# Patient Record
Sex: Male | Born: 1985 | Race: White | Hispanic: No | Marital: Married | Smoking: Never smoker
Health system: Southern US, Community
[De-identification: ages and names within clinical notes are randomized; demographics above are authoritative.]

## PROBLEM LIST (undated history)

## (undated) HISTORY — PX: LACERATION REPAIR: SHX5168

---

## 2014-07-25 ENCOUNTER — Encounter: Payer: Self-pay | Admitting: Emergency Medicine

## 2014-07-25 ENCOUNTER — Emergency Department (INDEPENDENT_AMBULATORY_CARE_PROVIDER_SITE_OTHER): Payer: Worker's Compensation

## 2014-07-25 ENCOUNTER — Emergency Department (INDEPENDENT_AMBULATORY_CARE_PROVIDER_SITE_OTHER)
Admission: EM | Admit: 2014-07-25 | Discharge: 2014-07-25 | Disposition: A | Discharge status: Home / Self Care | Payer: Worker's Compensation | Source: Home / Self Care | Attending: Emergency Medicine | Admitting: Emergency Medicine

## 2014-07-25 DIAGNOSIS — M25571 Pain in right ankle and joints of right foot: Secondary | ICD-10-CM | POA: Diagnosis not present

## 2014-07-25 NOTE — ED Notes (Signed)
Patient comes directly from work where he had cart roll onto back of heel and cause twisting of right ankle and foot. Cannot bear weight. No broken skin. Was given ice pack at work.

## 2014-07-25 NOTE — ED Provider Notes (Signed)
CSN: [id number]     Arrival date & time 07/25/14  1716 History   First MD Initiated Contact with Patient 07/25/14 1724     Chief Complaint  Patient presents with  . Foot Injury  . 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  Had immediate pain and difficulty walking.  No previous injury.  Pain moderate-severe, worse with motion, better with rest.  Hasn't used any meds/modalities yet.  History reviewed. No pertinent past medical history. Past Surgical History  Procedure Laterality Date  . Laceration repair      Dog bite as child; eye injury as adult   History reviewed. No pertinent family history. History  Substance Use Topics  . Smoking status: Never Smoker   . Smokeless tobacco: Not on file  . Alcohol Use: No   OB History    No data available     Review of Systems  All other systems reviewed and are negative.   Allergies  Review of patient's allergies indicates no known allergies.  Home Medications   Prior to Admission medications   Not on File   BP 115/73 mmHg  Pulse 73  Temp(Src) 98.7 F (37.1 C) (Oral)  Resp 18  Ht 5\' 11"  (1.803 m)  Wt 215 lb (97.523 kg)  BMI 30.00 kg/m2  SpO2 100% Physical Exam  Constitutional: She is oriented to person, place, and time. She appears well-developed and well-nourished. She does not appear ill. No distress.  HENT:  Head: Normocephalic and atraumatic.  Eyes: No scleral icterus.  Neck: Neck supple.  Cardiovascular: Regular rhythm and normal heart sounds.   Pulmonary/Chest: Effort normal and breath sounds normal. No respiratory distress.  Musculoskeletal:  R ankle/foot: FROM, + mild TTP generally at lateral foot and Achilles.  There is soft tissue swelling on the superior-lateral midfoot with associated tenderness.  No TTP medial/lateral malleolus, navicular, base of 5th, calcaneus,  Achilles, proximal fibula.  No swelling.  No ecchymoses.  Distal neurovascular status is intact.  Thompson test normal for Achilles function.  Negative Homan's.   Neurological: She is alert and oriented to person, place, and time.  Skin: Skin is warm and dry.  Psychiatric: She has a normal mood and affect. Her speech is normal.  Nursing note and vitals reviewed.   ED Course  Procedures (including critical care time) Labs Review Labs Reviewed - No data to display  Imaging Review Dg Ankle Complete Right  07/25/2014   CLINICAL DATA:  Injured right ankle/ foot at work.  Fell.  EXAM: RIGHT ANKLE - COMPLETE 3+ VIEW  COMPARISON:  None.  FINDINGS: There is no evidence of fracture, dislocation, or joint effusion. There is no evidence of arthropathy or other focal bone abnormality. Soft tissues are unremarkable.  IMPRESSION: Negative.   Electronically Signed   By: Signa Kellaylor  Stroud M.D.   On: 07/25/2014 18:57   Dg Foot Complete Right  07/25/2014   CLINICAL DATA:  Injury to right foot and ankle at work. Fell, and heard pop at the right ankle and foot. Posterior and lateral foot pain. Initial encounter.  EXAM: RIGHT FOOT COMPLETE - 3+ VIEW  COMPARISON:  None.  FINDINGS: There is no evidence of fracture or dislocation. The joint spaces are preserved. There is no evidence of talar subluxation; the subtalar joint is unremarkable in appearance. An os peroneum is noted.  No significant soft tissue abnormalities are seen.  IMPRESSION: 1. No evidence  of fracture or dislocation. 2. Os peroneum noted.   Electronically Signed   By: Roanna Raider M.D.   On: 07/25/2014 18:56     MDM   1. Pain in joint, ankle and foot, right   Xray taken of foot & ankle and read by radiology as above.  Rest, ice.  Work restrictions given.  Follow up next Mon/Tues in employer health as directed.  Paperwork given.    Worker's Comp Information   Return To Work: immediately with below restrictions   Work Restrictions: No work with R  foot/ankle until follow up visit  Referral (if indicated): N/A  MAKE FOLLOW-UP APPOINTMENT:  [facility] [place] [phone] [address], [address]  [place], [place] [address]       Marlaine Hind, MD 07/25/14 [phone]

## 2015-05-21 ENCOUNTER — Encounter: Payer: Self-pay | Admitting: Emergency Medicine

## 2015-05-21 ENCOUNTER — Emergency Department (INDEPENDENT_AMBULATORY_CARE_PROVIDER_SITE_OTHER)
Admission: EM | Admit: 2015-05-21 | Discharge: 2015-05-21 | Disposition: A | Discharge status: Home / Self Care | Payer: Worker's Compensation | Source: Home / Self Care | Attending: Family Medicine | Admitting: Family Medicine

## 2015-05-21 ENCOUNTER — Emergency Department (INDEPENDENT_AMBULATORY_CARE_PROVIDER_SITE_OTHER): Payer: Self-pay

## 2015-05-21 DIAGNOSIS — M25512 Pain in left shoulder: Secondary | ICD-10-CM

## 2015-05-21 DIAGNOSIS — M7702 Medial epicondylitis, left elbow: Secondary | ICD-10-CM

## 2015-05-21 DIAGNOSIS — G5692 Unspecified mononeuropathy of left upper limb: Secondary | ICD-10-CM

## 2015-05-21 DIAGNOSIS — M542 Cervicalgia: Secondary | ICD-10-CM

## 2015-05-21 MED ORDER — MELOXICAM 7.5 MG PO TABS: 7.5000 mg | ORAL_TABLET | Freq: Every day | ORAL | Status: DC

## 2015-05-21 MED ORDER — PREDNISONE 20 MG PO TABS: ORAL_TABLET | ORAL | Status: DC

## 2015-05-21 MED ORDER — MELOXICAM 7.5 MG PO TABS
7.5000 mg | ORAL_TABLET | Freq: Every day | ORAL | Status: DC
Start: 2015-05-21 — End: 2015-05-21

## 2015-05-21 NOTE — Discharge Instructions (Signed)
°  Meloxicam (Mobic) is an antiinflammatory to help with pain and inflammation.  Do not take ibuprofen, Advil, Aleve, or any other medications that contain NSAIDs while taking meloxicam as this may cause stomach upset or even ulcers if taken in large amounts for an extended period of time.  ° °

## 2015-05-21 NOTE — ED Provider Notes (Signed)
CSN: [id number]     Arrival date & time 05/21/15  1259 History   First MD Initiated Contact with Patient 05/21/15 1316     Chief Complaint  Patient presents with  . Hand Pain   (Consider location/radiation/quality/duration/timing/severity/associated sxs/prior Treatment) HPI  The pt is a 29yo male presenting to [facility] with c/o Left shoulder and arm soreness with associated numbness on the lateral aspect of his arm.  Pt works for [employer] .  Pain is minimal but he is concerned for the intermittent numbness and tingling along the ulnar aspect of his arm.  He reports lifting 12 packs of soda onto a shelf yesterday but he was not lifting or doing more than he normally does.    History reviewed. No pertinent past medical history. Past Surgical History  Procedure Laterality Date  . Laceration repair      Dog bite as child; eye injury as adult   No family history on file. Social History  Substance Use Topics  . Smoking status: Never Smoker   . Smokeless tobacco: None  . Alcohol Use: No    Review of Systems  Musculoskeletal: Positive for myalgias and arthralgias.       Left shoulder and arm  Skin: Negative for color change and wound.  Neurological: Positive for numbness. Negative for weakness.       Left arm    Allergies  Review of patient's allergies indicates no known allergies.  Home Medications   Prior to Admission medications   Medication Sig Start Date End Date Taking? Authorizing Provider  meloxicam (MOBIC) 7.5 MG tablet Take 1-2 tablets (7.5-15 mg total) by mouth daily. For 5 days, then daily as needed for pain 05/21/15   Junius Finner, PA-C  predniSONE (DELTASONE) 20 MG tablet 3 tabs po day one, then 2 po daily x 4 days 05/21/15   Junius Finner, PA-C   Meds Ordered and Administered this Visit  Medications - No data to display  BP 124/75 mmHg  Pulse 67  Temp(Src) 97.8 F (36.6 C) (Oral)  Ht 6' (1.829 m)  Wt 218 lb (98.884 kg)  BMI 29.56 kg/m2  SpO2 100% No data  found.   Physical Exam  Constitutional: He is oriented to person, place, and time. He appears well-developed and well-nourished.  HENT:  Head: Normocephalic and atraumatic.  Eyes: EOM are normal.  Neck: Normal range of motion.  Cardiovascular: Normal rate.   Pulses:      Radial pulses are 2+ on the left side.  Pulmonary/Chest: Effort normal.  Musculoskeletal: Normal range of motion. He exhibits tenderness. He exhibits no edema.  Left arm: full ROM. No deformity or edema. Tenderness over bicipital groove on anterior shoulder and over Left elbow medial epicondyle  5/5 strength bilaterally No midline spinal tenderness.  Neurological: He is alert and oriented to person, place, and time.  Decreased sensation in 4th and 5th digits on Left hand.  Skin: Skin is warm and dry.  Left arm: skin in tact. No ecchymosis or erythema.   Psychiatric: He has a normal mood and affect. His behavior is normal.  Nursing note and vitals reviewed.   ED Course  Procedures (including critical care time)  Labs Review Labs Reviewed - No data to display  Imaging Review Dg Cervical Spine Complete  05/21/2015  CLINICAL DATA:  Left hand tingling, numbness. EXAM: CERVICAL SPINE - COMPLETE 4+ VIEW COMPARISON:  None. FINDINGS: There is no evidence of cervical spine fracture or prevertebral soft tissue swelling. Alignment is normal. No  other significant bone abnormalities are identified. IMPRESSION: Negative cervical spine radiographs. Electronically Signed   By: Charlett NoseKevin  Dover M.D.   On: 05/21/2015 14:00   Dg Shoulder Left  05/21/2015  CLINICAL DATA:  Left arm tingling and numbness starting yesterday, neuropathy, left neck tightness EXAM: LEFT SHOULDER - 2+ VIEW COMPARISON:  None. FINDINGS: Three views of the left shoulder submitted. No acute fracture or subluxation. AC joint and glenohumeral joint are preserved. IMPRESSION: Negative. Electronically Signed   By: Natasha MeadLiviu  Pop M.D.   On: 05/21/2015 14:01      MDM    1. Left shoulder pain   2. Neuropathy, arm, left   3. Epicondylitis elbow, medial, left    Pt c/o Left arm soreness and tingling that started yesterday after lifting packs of soda at work. No known injury.  Tenderness to anterior shoulder and slightly altered sensation of 4th and 5th fingers on Left hand.    Plain films: normal exams  Symptoms likely due to inflammation of proximal biceps tendon, medial epicondylitis, and possible Left shoulder subacromial bursitis.    Encouraged rest, ice, and Meloxicam. Forearm band/strap provided for medial epicondylitis  Encouraged to f/u with Employee Health on Monday 4/3 or Tuesday 4/4 of next week if not improving.  No restrictions for work, however, he may wear arm strap. Patient verbalized understanding and agreement with treatment plan.    Junius Finnerrin O'Malley, PA-C 05/21/15 1501

## 2015-05-21 NOTE — ED Notes (Signed)
Yesterday morning putting 12 packs up on a shelf when my left hand went numb and felt shoulder pain. Intermittent tingling shock from elbow to fingers. Fingers and hand are numb and shoulder/pec pain is a 5

## 2015-05-28 ENCOUNTER — Encounter: Payer: Self-pay | Admitting: *Deleted

## 2015-05-28 ENCOUNTER — Emergency Department (INDEPENDENT_AMBULATORY_CARE_PROVIDER_SITE_OTHER)
Admission: EM | Admit: 2015-05-28 | Discharge: 2015-05-28 | Disposition: A | Discharge status: Home / Self Care | Payer: Worker's Compensation | Source: Home / Self Care | Attending: Family Medicine | Admitting: Family Medicine

## 2015-05-28 DIAGNOSIS — G5622 Lesion of ulnar nerve, left upper limb: Secondary | ICD-10-CM

## 2015-05-28 NOTE — ED Notes (Signed)
Pt is here today for a f/u of his workers comp injury. Still c/o LT hand numbness and arm pain.

## 2015-05-28 NOTE — Discharge Instructions (Signed)
Continue ice pack to left neck area three times daily.  Continue meloxicam.

## 2015-05-28 NOTE — ED Provider Notes (Signed)
CSN: [id number]     Arrival date & time 05/28/15  1520 History   First MD Initiated Contact with Patient 05/28/15 1629     Chief Complaint  Patient presents with  . Follow-up      HPI Comments: Patient returns for follow-up of occupational injury to left arm/shoulder.  He reports that his left arm and shoulder pain improved after finishing prednisone, but he now has persistent and constant tingling/vibrating paresthesia in his left hand, 4th and 5th fingers.  He also has mild soreness in his left trapezius and neck area, but reports that he has full range of motion of his left shoulder.  The history is provided by the patient.    History reviewed. No pertinent past medical history. Past Surgical History  Procedure Laterality Date  . Laceration repair      Dog bite as child; eye injury as adult   History reviewed. No pertinent family history. Social History  Substance Use Topics  . Smoking status: Never Smoker   . Smokeless tobacco: None  . Alcohol Use: No    Review of Systems  Constitutional: Negative for fever, chills, diaphoresis, appetite change and fatigue.  HENT: Negative.   Eyes: Negative.   Respiratory: Negative.   Cardiovascular: Negative.   Gastrointestinal: Negative.   Genitourinary: Negative.   Musculoskeletal: Positive for neck pain and neck stiffness. Negative for myalgias, back pain, joint swelling and arthralgias.  Skin: Negative.   Neurological: Positive for numbness.    Allergies  Review of patient's allergies indicates no known allergies.  Home Medications   Prior to Admission medications   Medication Sig Start Date End Date Taking? Authorizing Provider  meloxicam (MOBIC) 7.5 MG tablet Take 1-2 tablets (7.5-15 mg total) by mouth daily. For 5 days, then daily as needed for pain 05/21/15   Junius FinnerErin O'Malley, PA-C   Meds Ordered and Administered this Visit  Medications - No data to display  BP 119/71 mmHg  Pulse 80  Temp(Src) 98.1 F (36.7 C) (Oral)   Resp 16  Ht 6' (1.829 m)  Wt 215 lb (97.523 kg)  BMI 29.15 kg/m2  SpO2 96% No data found.   Physical Exam  Musculoskeletal:       Left shoulder: Normal.       Hands: Left hand has full range of motion all joints.  Slight decrease in grip strength.  Decreased sensation in ulnar aspect of hand including 4th and 5th fingers. Left elbow has full range of motion.  Palpation of the medial border of triceps muscle causes paresthesias in an ulnar nerve distribution.    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  Continue meloxicam.   Arrange referral to neurologist for further evaluation. Work restriction:  Limit use of left arm.  Lattie Haw, MD 05/28/15 [phone]

## 2015-06-06 ENCOUNTER — Encounter: Payer: Self-pay | Admitting: *Deleted

## 2015-06-06 ENCOUNTER — Emergency Department (INDEPENDENT_AMBULATORY_CARE_PROVIDER_SITE_OTHER)
Admission: EM | Admit: 2015-06-06 | Discharge: 2015-06-06 | Disposition: A | Discharge status: Home / Self Care | Payer: Worker's Compensation | Source: Home / Self Care | Attending: Family Medicine | Admitting: Family Medicine

## 2015-06-06 DIAGNOSIS — G5622 Lesion of ulnar nerve, left upper limb: Secondary | ICD-10-CM

## 2015-06-06 NOTE — ED Notes (Signed)
Pt was seen initially for left hand pain and numbness via WC claim through [employer].His claim was denied.  He has been on restricted duty. He currently denies pain and only has numbness to the left hand. He would like to return to work full duty.

## 2015-06-06 NOTE — Discharge Instructions (Signed)
May continue Ibuprofen 200mg , 4 tabs every 8 hours as needed.

## 2015-06-06 NOTE — ED Provider Notes (Signed)
CSN: [id number]     Arrival date & time 06/06/15  1213 History   First MD Initiated Contact with Patient 06/06/15 1315     Chief Complaint  Patient presents with  . Numbness    left hand      HPI Comments: Patient reports that he now has full use of his left upper extremity, and would like to resume his normal work duties.  His hand numbness has improved considerably, although he has some mild residual tingling in his left 4th and 5th fingers that does not affect the use of his hand.  The history is provided by the patient.    History reviewed. No pertinent past medical history. Past Surgical History  Procedure Laterality Date  . Laceration repair      Dog bite as child; eye injury as adult   History reviewed. No pertinent family history. Social History  Substance Use Topics  . Smoking status: Never Smoker   . Smokeless tobacco: None  . Alcohol Use: No    Review of Systems  Musculoskeletal: Negative for myalgias, joint swelling and arthralgias.  Neurological: Positive for numbness. Negative for tremors and weakness.  All other systems reviewed and are negative.   Allergies  Review of patient's allergies indicates no known allergies.  Home Medications   Prior to Admission medications   Medication Sig Start Date End Date Taking? Authorizing Provider  meloxicam (MOBIC) 7.5 MG tablet Take 1-2 tablets (7.5-15 mg total) by mouth daily. For 5 days, then daily as needed for pain 05/21/15   Junius FinnerErin O'Malley, PA-C   Meds Ordered and Administered this Visit  Medications - No data to display  BP 108/72 mmHg  Pulse 74  Wt 215 lb (97.523 kg)  SpO2 98% No data found.   Physical Exam  Constitutional: He is oriented to person, place, and time. He appears well-developed and well-nourished. No distress.  HENT:  Head: Normocephalic.  Eyes: Conjunctivae are normal. Pupils are equal, round, and reactive to light.  Neck: Normal range of motion.  Cardiovascular: Normal heart sounds.    Pulmonary/Chest: Breath sounds normal.  Musculoskeletal: He exhibits no edema or tenderness.  Left shoulder, elbow, wrist, and hand have range of motion all joints.  Left hand grip normal.  Neurological: He is alert and oriented to person, place, and time.  Skin: Skin is warm and dry.  Nursing note and vitals reviewed.   ED Course  Procedures    MDM   1. Ulnar neuropathy of left upper extremity; improved    May continue Ibuprofen 200mg , 4 tabs every 8 hours as needed. Resume normal work duties.   Lattie HawStephen A Beese, MD 06/09/15 [phone]

## 2017-03-14 IMAGING — CR DG CERVICAL SPINE COMPLETE 4+V
6 series · 6 of 6 positions shown · non-contrast
Comparison: None.

CLINICAL DATA: Left hand tingling, numbness.

EXAM:
CERVICAL SPINE - COMPLETE 4+ VIEW

[c-spine lat]
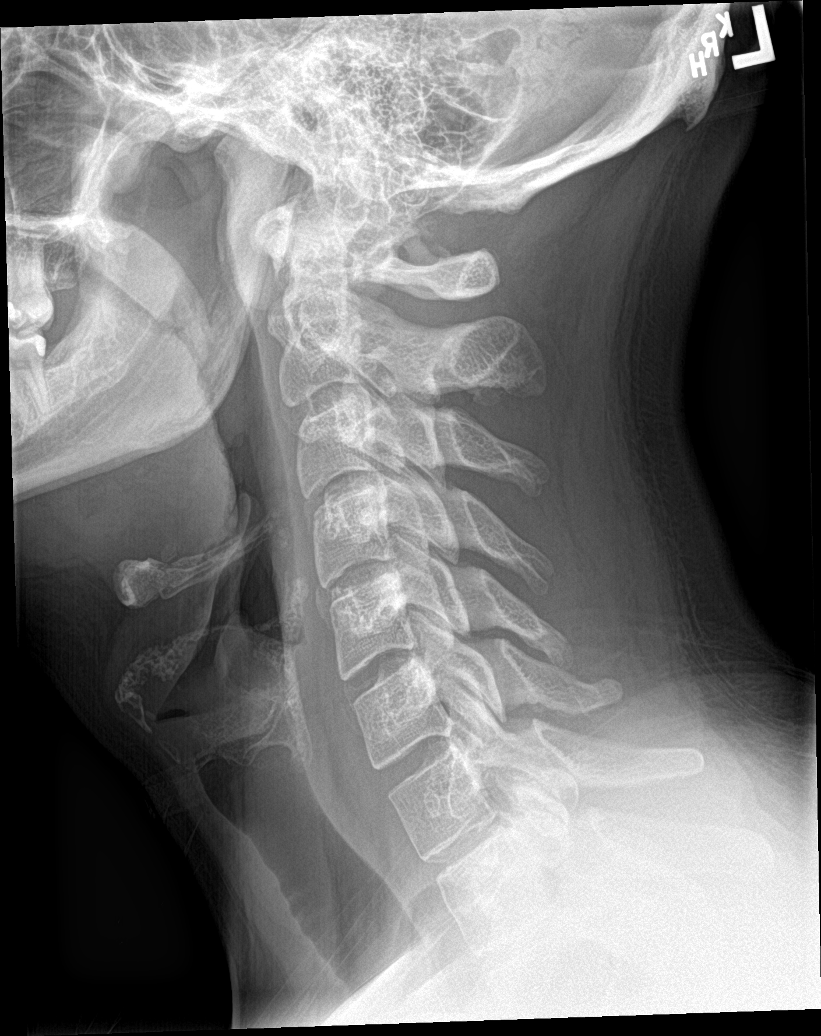

[c-spine obl (1 of 2)]
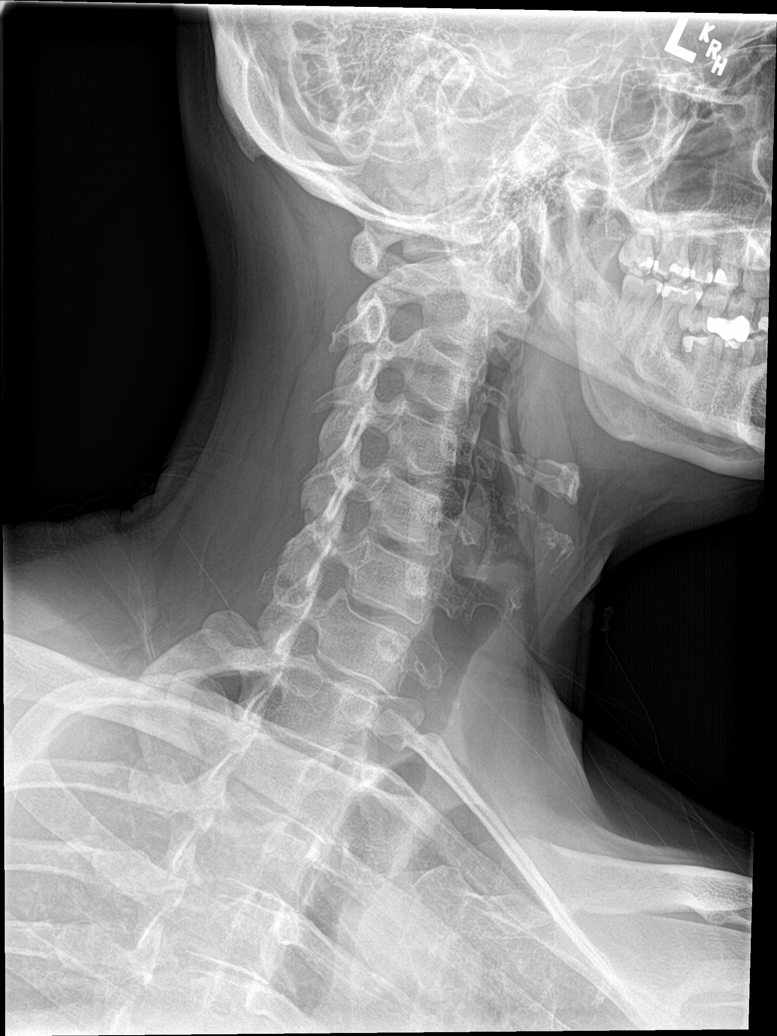

[c-spine obl (2 of 2)]
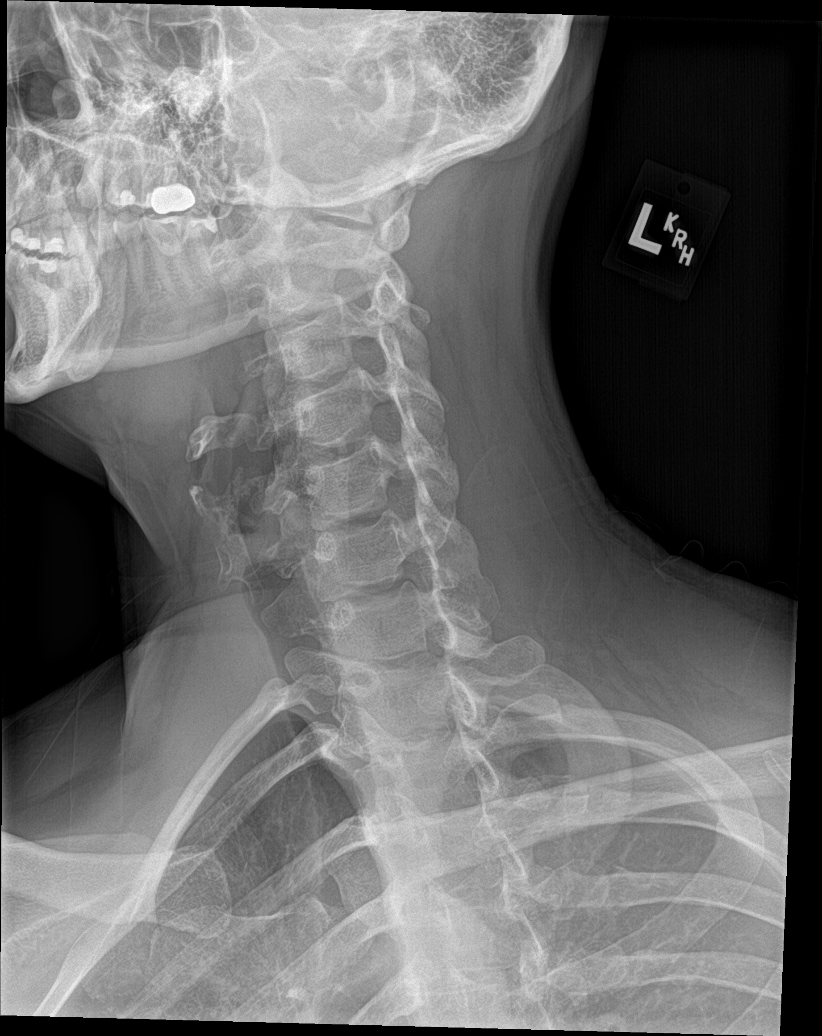

[c-spine ap]
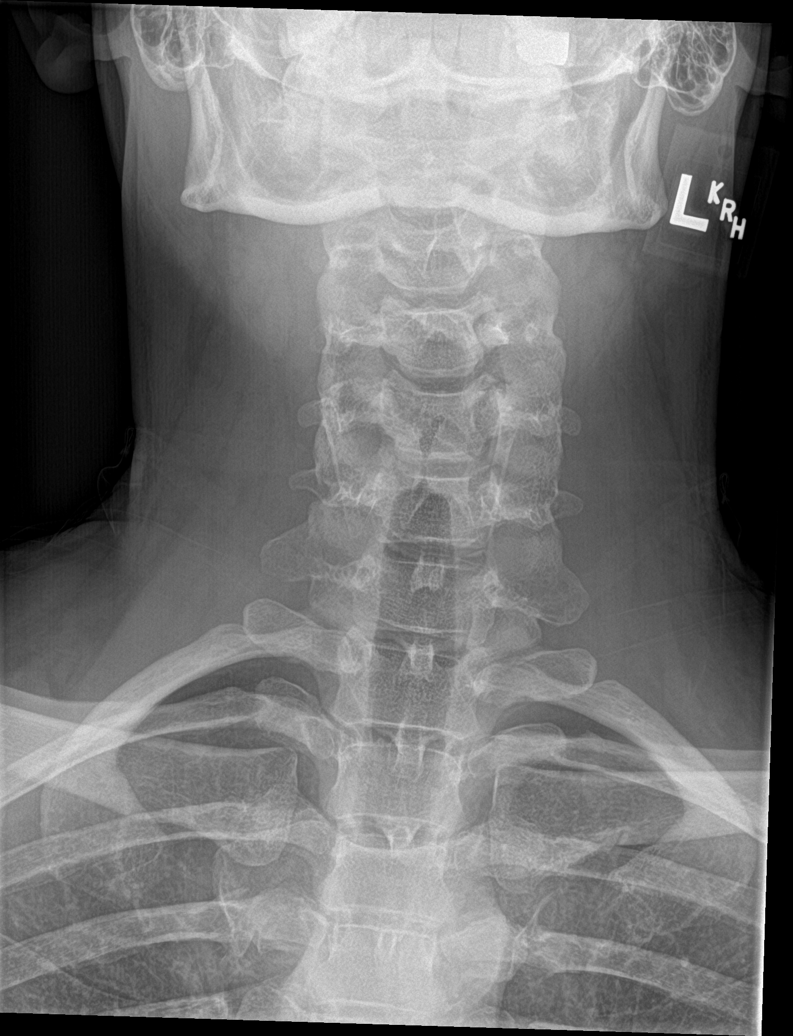

[c-spine open mouth]
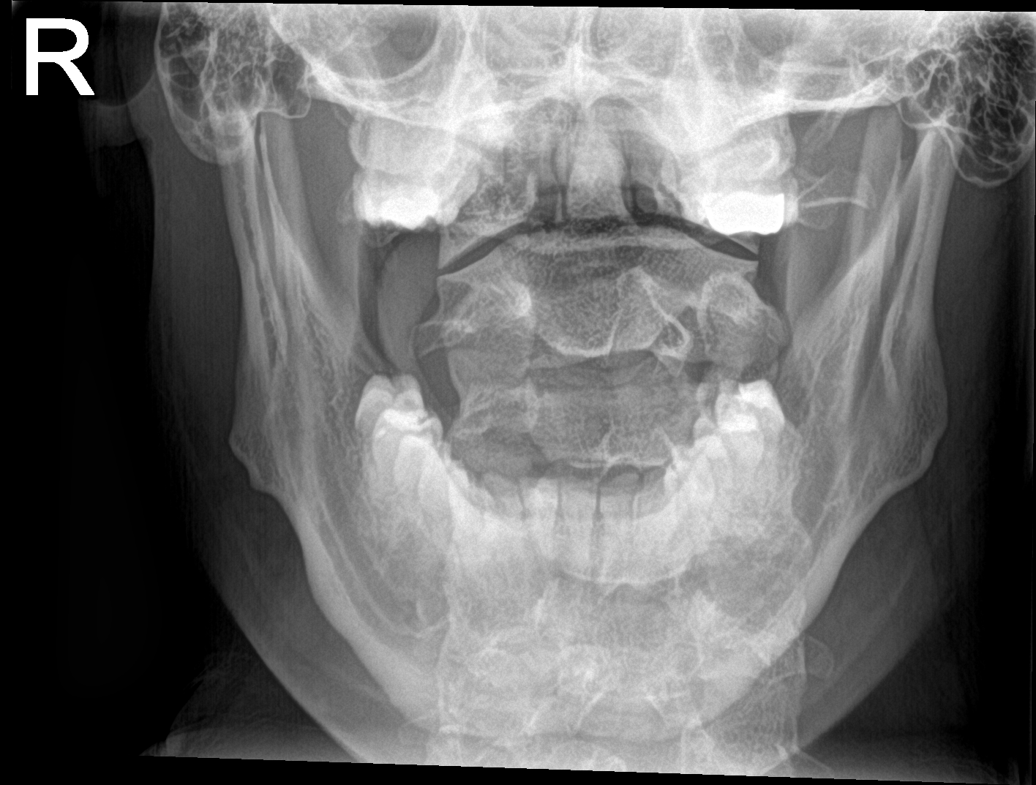

[c-spine swimmers]
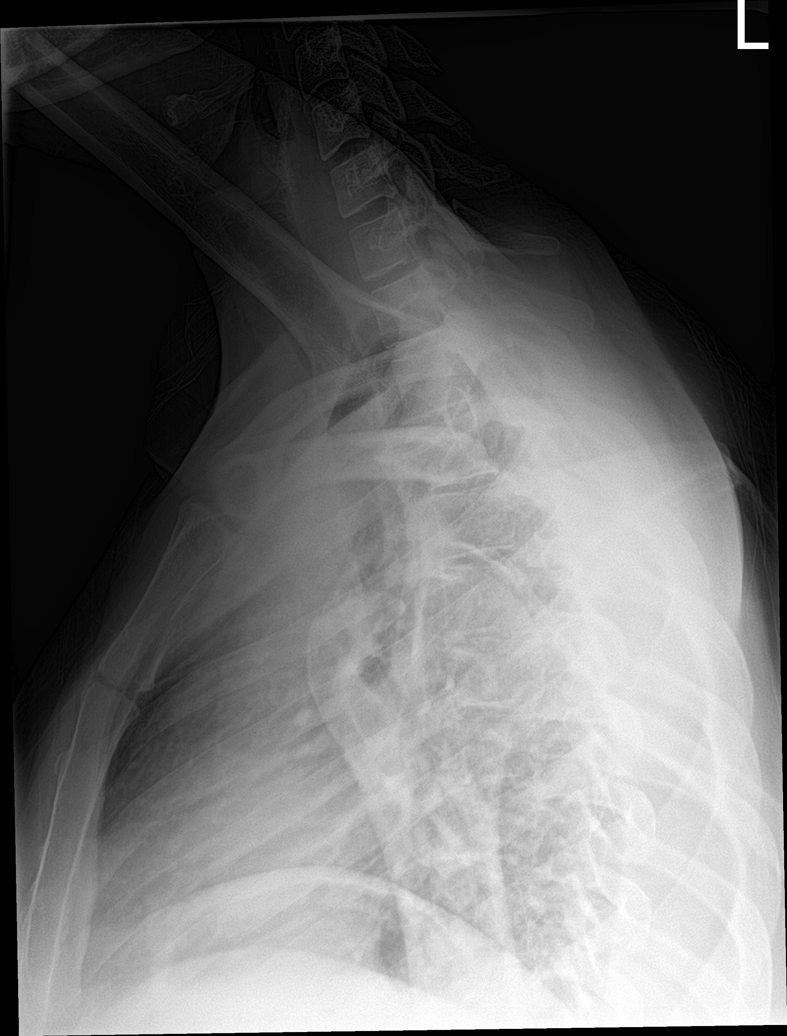

[6 of 6 positions shown; findings below may reference images not displayed]

FINDINGS: There is no evidence of cervical spine fracture or prevertebral soft
tissue swelling. Alignment is normal. No other significant bone
abnormalities are identified.
IMPRESSION: Negative cervical spine radiographs.

## 2018-11-09 ENCOUNTER — Emergency Department (INDEPENDENT_AMBULATORY_CARE_PROVIDER_SITE_OTHER)
Admission: EM | Admit: 2018-11-09 | Discharge: 2018-11-09 | Disposition: A | Discharge status: Home / Self Care | Payer: PRIVATE HEALTH INSURANCE | Source: Home / Self Care

## 2018-11-09 ENCOUNTER — Encounter: Payer: Self-pay | Admitting: Emergency Medicine

## 2018-11-09 ENCOUNTER — Other Ambulatory Visit: Payer: Self-pay

## 2018-11-09 DIAGNOSIS — Z299 Encounter for prophylactic measures, unspecified: Secondary | ICD-10-CM

## 2018-11-09 DIAGNOSIS — Z23 Encounter for immunization: Secondary | ICD-10-CM | POA: Diagnosis not present

## 2018-11-09 MED ORDER — TETANUS-DIPHTH-ACELL PERTUSSIS 5-2.5-18.5 LF-MCG/0.5 IM SUSP
0.5000 mL | Freq: Once | INTRAMUSCULAR | Status: AC
  Administered 2018-11-09: 0.5 mL via INTRAMUSCULAR

## 2018-11-09 NOTE — ED Triage Notes (Signed)
TDAP

## 2019-03-20 ENCOUNTER — Emergency Department (INDEPENDENT_AMBULATORY_CARE_PROVIDER_SITE_OTHER): Payer: Commercial Managed Care - PPO

## 2019-03-20 ENCOUNTER — Emergency Department (INDEPENDENT_AMBULATORY_CARE_PROVIDER_SITE_OTHER)
Admission: EM | Admit: 2019-03-20 | Discharge: 2019-03-20 | Disposition: A | Discharge status: Home / Self Care | Payer: Commercial Managed Care - PPO | Source: Home / Self Care | Attending: Family Medicine | Admitting: Family Medicine

## 2019-03-20 ENCOUNTER — Other Ambulatory Visit: Payer: Self-pay

## 2019-03-20 DIAGNOSIS — M542 Cervicalgia: Secondary | ICD-10-CM | POA: Diagnosis not present

## 2019-03-20 DIAGNOSIS — M5412 Radiculopathy, cervical region: Secondary | ICD-10-CM

## 2019-03-20 MED ORDER — CYCLOBENZAPRINE HCL 10 MG PO TABS: 10.0000 mg | ORAL_TABLET | Freq: Every day | ORAL | 0 refills | Status: DC

## 2019-03-20 MED ORDER — PREDNISONE 20 MG PO TABS: ORAL_TABLET | ORAL | 0 refills | Status: DC

## 2019-03-20 NOTE — ED Triage Notes (Signed)
Couple of weeks ago had a couple of pains in the chest.  It has happened several times over the last couple of weeks.  It has become worse in the each time.  Feels like the left arm and left side of face feels weird.

## 2019-03-20 NOTE — ED Provider Notes (Signed)
[facility]    CSN: [id number] Arrival date & time: 03/20/19  1426      History   Chief Complaint Chest pain   HPI Cesar Daniel is a 34 y.o. male.   About 2 weeks ago patient had a sudden episode of shock-like pain in his left upper chest, shoulder and upper arm. The pain occurred again 4 to 5 days ago, and again 2 days ago.  The episodes are becoming more intense and last longer.  He reports that he has a "weird" sensation in his left arm and face.  He denies nausea/vomiting, shortness of breath, sweats with occurrence of his symptoms.  He denies any recent injury or change in activities. He is assymptomatic at present. On reflection, he recalls that he has had infrequent similar episodes as far back as his teen years. Family history positive for MI in his father.    The history is provided by the patient.  Chest Pain Pain location:  L chest Pain quality: shooting and stabbing   Pain radiates to:  Neck, L shoulder, L arm and upper back Pain severity:  Mild Onset quality:  Sudden Timing:  Sporadic Progression:  Worsening Chronicity:  Recurrent Context: movement   Relieved by:  None tried Worsened by:  Certain positions and movement Ineffective treatments:  None tried Associated symptoms: no abdominal pain, no anorexia, no back pain, no cough, no diaphoresis, no dysphagia, no fatigue, no fever, no headache, no heartburn, no lower extremity edema, no nausea, no near-syncope, no numbness, no palpitations, no shortness of breath, no syncope, no vomiting and no weakness   Risk factors: male sex     History reviewed. No recorded past medical problems  There are no current problems to display for this patient.   Past Surgical History:  Procedure Laterality Date   LACERATION REPAIR     Dog bite as child; eye injury as adult       Home Medications    Prior to Admission medications   Medication Sig Start Date End Date Taking? Authorizing Provider    cyclobenzaprine (FLEXERIL) 10 MG tablet Take 1 tablet (10 mg total) by mouth at bedtime. 03/20/19   Kandra Nicolas, MD  predniSONE (DELTASONE) 20 MG tablet Take one tab by mouth twice daily for 4 days, then one daily. Take with food. 03/20/19   Kandra Nicolas, MD    Family History Family History  Problem Relation Age of Onset   Diabetes Father    Heart failure/MI Father     Social History Social History   Tobacco Use   Smoking status: Never Smoker   Smokeless tobacco: Never Used  Substance Use Topics   Alcohol use: No   Drug use: No     Allergies   Patient has no known allergies.   Review of Systems Review of Systems  Constitutional: Negative for diaphoresis, fatigue and fever.  HENT: Negative for trouble swallowing.   Respiratory: Negative for cough and shortness of breath.   Cardiovascular: Positive for chest pain. Negative for palpitations, syncope and near-syncope.  Gastrointestinal: Negative for abdominal pain, anorexia, heartburn, nausea and vomiting.  Musculoskeletal: Negative for back pain.  Neurological: Negative for weakness, numbness and headaches.  All other systems reviewed and are negative.    Physical Exam Triage Vital Signs ED Triage Vitals  Enc Vitals Group     BP 03/20/19 1511 117/77     Pulse Rate 03/20/19 1511 73     Resp 03/20/19 1511 20  Temp 03/20/19 1511 98.3 F (36.8 C)     Temp Source 03/20/19 1511 Oral     SpO2 03/20/19 1511 98 %     Weight 03/20/19 1453 215 lb (97.5 kg)     Height 03/20/19 1453 5\' 11"  (1.803 m)     Head Circumference --      Peak Flow --      Pain Score 03/20/19 1453 4     Pain Loc --      Pain Edu? --      Excl. in GC? --    No data found.  Updated Vital Signs BP 117/77 (BP Location: Left Arm)    Pulse 73    Temp 98.3 F (36.8 C) (Oral)    Resp 20    Ht 5\' 11"  (1.803 m)    Wt 97.5 kg    SpO2 98%    BMI 29.99 kg/m   Visual Acuity Right Eye Distance:   Left Eye Distance:   Bilateral  Distance:    Right Eye Near:   Left Eye Near:    Bilateral Near:     Physical Exam Vitals and nursing note reviewed.  Constitutional:      General: He is not in acute distress.    Appearance: He is not ill-appearing.  HENT:     Head: Normocephalic.     Right Ear: External ear normal.     Left Ear: External ear normal.     Nose: Nose normal.     Mouth/Throat:     Pharynx: Oropharynx is clear.  Eyes:     Conjunctiva/sclera: Conjunctivae normal.     Pupils: Pupils are equal, round, and reactive to light.  Neck:     Comments: Patient's symptoms are reproduced by flexion of his neck laterally to the left, rotation of his head to the left, and flexion of his head (chin to chest). Cardiovascular:     Rate and Rhythm: Normal rate and regular rhythm.     Heart sounds: Normal heart sounds.  Pulmonary:     Breath sounds: Normal breath sounds.  Abdominal:     Tenderness: There is no abdominal tenderness.  Musculoskeletal:     Cervical back: Normal range of motion and neck supple.     Right lower leg: No edema.     Left lower leg: No edema.  Skin:    General: Skin is warm and dry.     Findings: No rash.          Comments: Patient's distribution of pain is noted on diagram, but there is no tenderness to palpation in these areas.  Neurological:     General: No focal deficit present.     Mental Status: He is alert and oriented to person, place, and time.      UC Treatments / Results  Labs (all labs ordered are listed, but only abnormal results are displayed) Labs Reviewed - No data to display  EKG  Rate:  66 BPM PR:  168 msec QT:  376 msec QTcH:  394 msec QRSD:  92 msec QRS axis:  51 degrees Interpretation:  normal sinus rhythm; possible left atrial enlargement; no acute changes  Radiology DG Cervical Spine Complete  Result Date: 03/20/2019 CLINICAL DATA:  Cervicalgia with radicular type pain EXAM: CERVICAL SPINE - COMPLETE 4+ VIEW COMPARISON:  None. FINDINGS:  Frontal, lateral, open-mouth odontoid, and bilateral oblique views were obtained. There is no appreciable fracture or spondylolisthesis. Prevertebral soft tissues and predental space regions are  normal. The disc spaces appear normal. There is no appreciable exit foraminal narrowing on the oblique views. There is reversal of lordotic curvature. Lung apices are clear. IMPRESSION: Reversal of lordotic curvature, a finding likely indicative of muscle spasm. No appreciable fracture or spondylolisthesis. No appreciable arthropathy. Electronically Signed   By: Bretta Bang III M.D.   On: 03/20/2019 16:06    Procedures Procedures (including critical care time)  Medications Ordered in UC Medications - No data to display  Initial Impression / Assessment and Plan / UC Course  I have reviewed the triage vital signs and the nursing notes.  Pertinent labs & imaging results that were available during my care of the patient were reviewed by me and considered in my medical decision making (see chart for details).    Begin prednisone burst/taper, & Flexeril at bedtime.  Followup with Dr. Rodney [facility] ([facility]) in one week for follow-up.   Final Clinical Impressions(s) / UC Diagnoses   Final diagnoses:  Neck pain on left side  Cervical radiculopathy, chronic     Discharge Instructions     Apply ice pack for 20 to 30 minutes, 3 to 4 times daily  Continue until pain and swelling decrease.  May take Tylenol, if needed, for pain.    ED Prescriptions    Medication Sig Dispense Auth. Provider   predniSONE (DELTASONE) 20 MG tablet Take one tab by mouth twice daily for 4 days, then one daily. Take with food. 12 tablet Lattie Haw, MD   cyclobenzaprine (FLEXERIL) 10 MG tablet Take 1 tablet (10 mg total) by mouth at bedtime. 10 tablet Lattie Haw, MD        Lattie Haw, MD 03/22/19 [phone]

## 2019-03-20 NOTE — Discharge Instructions (Addendum)
Apply ice pack for 20 to 30 minutes, 3 to 4 times daily  Continue until pain and swelling decrease.  May take Tylenol, if needed, for pain.

## 2019-03-28 ENCOUNTER — Ambulatory Visit (INDEPENDENT_AMBULATORY_CARE_PROVIDER_SITE_OTHER): Payer: Commercial Managed Care - PPO | Admitting: Sports Medicine

## 2019-03-28 ENCOUNTER — Other Ambulatory Visit: Payer: Self-pay

## 2019-03-28 ENCOUNTER — Ambulatory Visit (INDEPENDENT_AMBULATORY_CARE_PROVIDER_SITE_OTHER): Payer: Commercial Managed Care - PPO

## 2019-03-28 DIAGNOSIS — M7989 Other specified soft tissue disorders: Secondary | ICD-10-CM | POA: Insufficient documentation

## 2019-03-28 DIAGNOSIS — M940 Chondrocostal junction syndrome [Tietze]: Secondary | ICD-10-CM | POA: Insufficient documentation

## 2019-03-28 MED ORDER — MELOXICAM 15 MG PO TABS
ORAL_TABLET | ORAL | 3 refills | Status: DC
Start: 2019-03-28 — End: 2020-02-07

## 2019-03-28 NOTE — Assessment & Plan Note (Signed)
Cesar Daniel has an odd constellation of symptoms, he does endorse a momentary swelling of his left leg followed by some pleuritic chest pain. For this reason we are going to check a D-dimer. He is also going to establish care with one of our primary care providers here.

## 2019-03-28 NOTE — Patient Instructions (Signed)
Costochondritis  Costochondritis is swelling and irritation (inflammation) of the tissue (cartilage) that connects your ribs to your breastbone (sternum). This causes pain in the front of your chest. The pain usually starts gradually and involves more than one rib. What are the causes? The exact cause of this condition is not always known. It results from stress on the cartilage where your ribs attach to your sternum. The cause of this stress could be:  Chest injury (trauma).  Exercise or activity, such as lifting.  Severe coughing. What increases the risk? You may be at higher risk for this condition if you:  Are male.  Are 30?32 years old.  Recently started a new exercise or work activity.  Have low levels of vitamin D.  Have a condition that makes you cough frequently. What are the signs or symptoms? The main symptom of this condition is chest pain. The pain:  Usually starts gradually and can be sharp or dull.  Gets worse with deep breathing, coughing, or exercise.  Gets better with rest.  May be worse when you press on the sternum-rib connection (tenderness). How is this diagnosed? This condition is diagnosed based on your symptoms, medical history, and a physical exam. Your health care provider will check for tenderness when pressing on your sternum. This is the most important finding. You may also have tests to rule out other causes of chest pain. These may include:  A chest X-ray to check for lung problems.  An electrocardiogram (ECG) to see if you have a heart problem that could be causing the pain.  An imaging scan to rule out a chest or rib fracture. How is this treated? This condition usually goes away on its own over time. Your health care provider may prescribe an NSAID to reduce pain and inflammation. Your health care provider may also suggest that you:  Rest and avoid activities that make pain worse.  Apply heat or cold to the area to reduce pain and  inflammation.  Do exercises to stretch your chest muscles. If these treatments do not help, your health care provider may inject a numbing medicine at the sternum-rib connection to help relieve the pain. Follow these instructions at home:  Avoid activities that make pain worse. This includes any activities that use chest, abdominal, and side muscles.  If directed, put ice on the painful area: ? Put ice in a plastic bag. ? Place a towel between your skin and the bag. ? Leave the ice on for 20 minutes, 2-3 times a day.  If directed, apply heat to the affected area as often as told by your health care provider. Use the heat source that your health care provider recommends, such as a moist heat pack or a heating pad. ? Place a towel between your skin and the heat source. ? Leave the heat on for 20-30 minutes. ? Remove the heat if your skin turns bright red. This is especially important if you are unable to feel pain, heat, or cold. You may have a greater risk of getting burned.  Take over-the-counter and prescription medicines only as told by your health care provider.  Return to your normal activities as told by your health care provider. Ask your health care provider what activities are safe for you.  Keep all follow-up visits as told by your health care provider. This is important. Contact a health care provider if:  You have chills or a fever.  Your pain does not go away or it gets   worse.  You have a cough that does not go away (is persistent). Get help right away if:  You have shortness of breath. This information is not intended to replace advice given to you by your health care provider. Make sure you discuss any questions you have with your health care provider. Document Revised: 02/23/2017 Document Reviewed: 06/04/2015 Elsevier Patient Education  2020 Elsevier Inc.  

## 2019-03-28 NOTE — Progress Notes (Signed)
    Procedures performed today:    None.  Independent interpretation of tests performed by another provider:   Cervical spine x-ray personally reviewed and shows straightening of the normal cervical lordosis but otherwise negative.  Impression and Recommendations:    Left leg swelling Cesar Daniel has an odd constellation of symptoms, he does endorse a momentary swelling of his left leg followed by some pleuritic chest pain. For this reason we are going to check a D-dimer. He is also going to establish care with one of our primary care providers here.  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   For the past week he has had chest wall pain with radiation up into the neck and left arm but not exertional, no diaphoresis, no palpitations. It is pleuritic, it is reproducible with palpation over the left mid costal cartilage all consistent with costochondritis. When he finishes his prednisone we will start meloxicam, adding a chest x-ray. I reassured him that this would likely get better on its own. Return to see me in 2 weeks.    ___________________________________________ Cesar Daniel. Cesar Daniel, M.D., ABFM., CAQSM. Primary Care and Sports Medicine [facility]  Adjunct Instructor of Family Medicine  [employer]

## 2019-03-28 NOTE — Assessment & Plan Note (Signed)
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   For the past week he has had chest wall pain with radiation up into the neck and left arm but not exertional, no diaphoresis, no palpitations. It is pleuritic, it is reproducible with palpation over the left mid costal cartilage all consistent with costochondritis. When he finishes his prednisone we will start meloxicam, adding a chest x-ray. I reassured him that this would likely get better on its own. Return to see me in 2 weeks.

## 2019-03-29 LAB — COMPLETE METABOLIC PANEL WITH GFR
AG Ratio: 1.7 (calc) (ref 1.0–2.5)
ALT: 22 U/L (ref 9–46)
AST: 17 U/L (ref 10–40)
Albumin: 4.8 g/dL (ref 3.6–5.1)
Alkaline phosphatase (APISO): 51 U/L (ref 36–130)
BUN: 14 mg/dL (ref 7–25)
CO2: 27 mmol/L (ref 20–32)
Calcium: 10 mg/dL (ref 8.6–10.3)
Chloride: 102 mmol/L (ref 98–110)
Creat: 0.89 mg/dL (ref 0.60–1.35)
GFR, Est African American: 132 mL/min/{1.73_m2} (ref >=60)
GFR, Est Non African American: 114 mL/min/{1.73_m2} (ref >=60)
Globulin: 2.9 g/dL (calc) (ref 1.9–3.7)
Glucose, Bld: 77 mg/dL (ref 65–99)
Potassium: 4.3 mmol/L (ref 3.5–5.3)
Sodium: 141 mmol/L (ref 135–146)
Total Bilirubin: 0.3 mg/dL (ref 0.2–1.2)
Total Protein: 7.7 g/dL (ref 6.1–8.1)

## 2019-03-29 LAB — CBC WITH DIFFERENTIAL/PLATELET
Absolute Monocytes: 564 cells/uL (ref 200–950)
Basophils Absolute: 102 cells/uL (ref 0–200)
Basophils Relative: 1.5 %
Eosinophils Absolute: 109 cells/uL (ref 15–500)
Eosinophils Relative: 1.6 %
HCT: 45.3 % (ref 38.5–50.0)
Hemoglobin: 15.5 g/dL (ref 13.2–17.1)
Lymphs Abs: 3495 cells/uL (ref 850–3900)
MCH: 30.5 pg (ref 27.0–33.0)
MCHC: 34.2 g/dL (ref 32.0–36.0)
MCV: 89.2 fL (ref 80.0–100.0)
MPV: 10.2 fL (ref 7.5–12.5)
Monocytes Relative: 8.3 %
Neutro Abs: 2530 cells/uL (ref 1500–7800)
Neutrophils Relative %: 37.2 %
Platelets: 336 10*3/uL (ref 140–400)
RBC: 5.08 10*6/uL (ref 4.20–5.80)
RDW: 12.9 % (ref 11.0–15.0)
Total Lymphocyte: 51.4 %
WBC: 6.8 10*3/uL (ref 3.8–10.8)

## 2019-03-29 LAB — D-DIMER, QUANTITATIVE (NOT AT ARMC): D-Dimer, Quant: 0.46 mcg/mL FEU (ref <0.50)

## 2019-04-09 ENCOUNTER — Ambulatory Visit: Payer: Commercial Managed Care - PPO | Admitting: Family Medicine

## 2019-04-20 ENCOUNTER — Ambulatory Visit: Payer: Commercial Managed Care - PPO | Admitting: Family Medicine

## 2019-04-30 ENCOUNTER — Encounter: Payer: Self-pay | Admitting: Family Medicine

## 2019-04-30 ENCOUNTER — Other Ambulatory Visit: Payer: Self-pay

## 2019-04-30 ENCOUNTER — Ambulatory Visit (INDEPENDENT_AMBULATORY_CARE_PROVIDER_SITE_OTHER): Payer: Commercial Managed Care - PPO | Admitting: Family Medicine

## 2019-04-30 VITALS — BP 116/76 | HR 80 | Temp 97.6°F | Ht 71.0 in | Wt 232.0 lb

## 2019-04-30 DIAGNOSIS — Z13220 Encounter for screening for lipoid disorders: Secondary | ICD-10-CM

## 2019-04-30 DIAGNOSIS — M7989 Other specified soft tissue disorders: Secondary | ICD-10-CM

## 2019-04-30 DIAGNOSIS — Z0000 Encounter for general adult medical examination without abnormal findings: Secondary | ICD-10-CM | POA: Diagnosis not present

## 2019-04-30 DIAGNOSIS — M940 Chondrocostal junction syndrome [Tietze]: Secondary | ICD-10-CM | POA: Diagnosis not present

## 2019-04-30 NOTE — Assessment & Plan Note (Signed)
This has improved since initial evaluation by Dr. Benjamin Stain last month He may continue meloxicam as needed.

## 2019-04-30 NOTE — Progress Notes (Signed)
Cesar Daniel - 34 y.o. male MRN [id number]  Date of birth: 28-Jan-1986  Subjective Chief Complaint  Patient presents with  . Establish Care    HPI Cesar Daniel is a 34 y.o. male here today for initial visit with new PCP.  He was seen about 1 month ago by Dr. Dianah Field from chest pain and leg swelling.  Chest pain thought to be related to costochondritis however CXR and D-dimer checked due to leg swelling.  He has not experienced any further swelling in his leg since that visit and chest pain has gotten much better.  He does have a family history of heart disease including father, PGF and PGGF with MI.    ROS:  A comprehensive ROS was completed and negative except as noted per HPI  No Known Allergies  No past medical history on file.  Past Surgical History:  Procedure Laterality Date  . LACERATION REPAIR     Dog bite as child; eye injury as adult    Social History   Socioeconomic History  . Marital status: Single    Spouse name: Not on file  . Number of children: Not on file  . Years of education: Not on file  . Highest education level: Not on file  Occupational History  . Not on file  Tobacco Use  . Smoking status: Never Smoker  . Smokeless tobacco: Never Used  Substance and Sexual Activity  . Alcohol use: No  . Drug use: No  . Sexual activity: Not on file  Other Topics Concern  . Not on file  Social History Narrative  . Not on file   Social Determinants of Health   Financial Resource Strain:   . Difficulty of Paying Living Expenses: Not on file  Food Insecurity:   . Worried About Charity fundraiser in the Last Year: Not on file  . Ran Out of Food in the Last Year: Not on file  Transportation Needs:   . Lack of Transportation (Medical): Not on file  . Lack of Transportation (Non-Medical): Not on file  Physical Activity:   . Days of Exercise per Week: Not on file  . Minutes of Exercise per Session: Not on file  Stress:   . Feeling of Stress :  Not on file  Social Connections:   . Frequency of Communication with Friends and Family: Not on file  . Frequency of Social Gatherings with Friends and Family: Not on file  . Attends Religious Services: Not on file  . Active Member of Clubs or Organizations: Not on file  . Attends Archivist Meetings: Not on file  . Marital Status: Not on file    Family History  Problem Relation Age of Onset  . Diabetes Father   . Heart failure Father     Health Maintenance  Topic Date Due  . INFLUENZA VACCINE  05/23/2019 (Originally 09/23/2018)  . HIV Screening  04/29/2020 (Originally 12/19/2002)  . TETANUS/TDAP  11/08/2028     ----------------------------------------------------------------------------------------------------------------------------------------------------------------------------------------------------------------- Physical Exam BP 116/76   Pulse 80   Temp 97.6 F (36.4 C) (Oral)   Ht 5\' 11"  (1.803 m)   Wt 232 lb (105.2 kg)   BMI 32.36 kg/m   Physical Exam Constitutional:      Appearance: Normal appearance.  HENT:     Head: Normocephalic and atraumatic.  Cardiovascular:     Rate and Rhythm: Normal rate and regular rhythm.  Pulmonary:     Effort: Pulmonary effort is normal.  Breath sounds: Normal breath sounds.  Musculoskeletal:     Cervical back: Neck supple.  Skin:    General: Skin is warm.  Neurological:     General: No focal deficit present.     Mental Status: He is alert.  Psychiatric:        Mood and Affect: Mood normal.        Behavior: Behavior normal.     ------------------------------------------------------------------------------------------------------------------------------------------------------------------------------------------------------------------- Assessment and Plan  Costochondritis This has improved since initial evaluation by Dr. Benjamin Stain last month He may continue meloxicam as needed.   Left leg  swelling No re-occurence in the past month.    He will schedule CPE.  Given lab orders to have these completed prior to appt if he chooses.   No orders of the defined types were placed in this encounter.   No follow-ups on file.    This visit occurred during the SARS-CoV-2 public health emergency.  Safety protocols were in place, including screening questions prior to the visit, additional usage of staff PPE, and extensive cleaning of exam room while observing appropriate contact time as indicated for disinfecting solutions.

## 2019-04-30 NOTE — Assessment & Plan Note (Signed)
No re-occurence in the past month.

## 2019-04-30 NOTE — Patient Instructions (Signed)
Great to meet you today! Please schedule a visit in a few weeks for annual physical.  Come fasting to this appointment or stop in to have labs completed prior to appt and we can review at your appointment.

## 2019-05-21 ENCOUNTER — Encounter: Payer: Commercial Managed Care - PPO | Admitting: Family Medicine

## 2020-02-07 ENCOUNTER — Telehealth (INDEPENDENT_AMBULATORY_CARE_PROVIDER_SITE_OTHER): Payer: Commercial Managed Care - PPO | Admitting: Family Medicine

## 2020-02-07 ENCOUNTER — Encounter: Payer: Self-pay | Admitting: Family Medicine

## 2020-02-07 DIAGNOSIS — Z202 Contact with and (suspected) exposure to infections with a predominantly sexual mode of transmission: Secondary | ICD-10-CM | POA: Insufficient documentation

## 2020-02-07 DIAGNOSIS — A749 Chlamydial infection, unspecified: Secondary | ICD-10-CM

## 2020-02-07 MED ORDER — AZITHROMYCIN 500 MG PO TABS: 1000.0000 mg | ORAL_TABLET | Freq: Once | ORAL | 0 refills | Status: AC

## 2020-02-07 NOTE — Progress Notes (Signed)
Advised patient to send pdf of STI results to Dr. Ashley Royalty.

## 2020-02-07 NOTE — Progress Notes (Signed)
Cesar Daniel - 34 y.o. male MRN [id number]  Date of birth: 16-Jun-1985   This visit type was conducted due to national recommendations for restrictions regarding the COVID-19 Pandemic (e.g. social distancing).  This format is felt to be most appropriate for this patient at this time.  All issues noted in this document were discussed and addressed.  No physical exam was performed (except for noted visual exam findings with Video Visits).  I discussed the limitations of evaluation and management by telemedicine and the availability of in person appointments. The patient expressed understanding and agreed to proceed.  I connected with@ on 02/07/20 at  9:10 AM EST by a video enabled telemedicine application and verified that I am speaking with the correct person using two identifiers.  Present at visit: Everrett Coombe, DO Vevelyn Royals   Patient Location: Home [address] [place] [address]   Provider location:   [facility]  No chief complaint on file.   HPI  Cesar Daniel is a 34 y.o. male who presents via audio/video conferencing for a telehealth visit today.  Appt notes state patient tested positive for COVID however he denies this and has no COVID symptoms.  He had STI screening recently and tested positive for chlamydia.  He has had some mild dysuria but denies discharge, testicular pain, lymph node enlargement.    Testing for gonorrhea, HIV, HSV and syphilis negative.    ROS:  A comprehensive ROS was completed and negative except as noted per HPI  History reviewed. No pertinent past medical history.  Past Surgical History:  Procedure Laterality Date  . LACERATION REPAIR     Dog bite as child; eye injury as adult    Family History  Problem Relation Age of Onset  . Diabetes Father   . Heart failure Father     Social History   Socioeconomic History  . Marital status: Married    Spouse name: Not on file  . Number of children: Not on file  . Years  of education: Not on file  . Highest education level: Not on file  Occupational History  . 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 Smoking status: Never Smoker  . Smokeless tobacco: Never Used  Vaping Use  . Vaping Use: Never used  Substance and Sexual Activity  . Alcohol use: No  . Drug use: No  . Sexual activity: Yes    Birth control/protection: Condom  Other Topics Concern  . Not on file  Social History Narrative  . Not on file   Social Determinants of Health   Financial Resource Strain: Not on file  Food Insecurity: Not on file  Transportation Needs: Not on file  Physical Activity: Not on file  Stress: Not on file  Social Connections: Not on file  Intimate Partner Violence: Not on file     Current Outpatient Medications:  .  azithromycin (ZITHROMAX) 500 MG tablet, Take 2 tablets (1,000 mg total) by mouth once for 1 dose., Disp: 2 tablet, Rfl: 0  EXAM:  VITALS per patient if applicable: Wt 232 lb (105.2 kg)   BMI 32.36 kg/m   GENERAL: alert, oriented, appears well and in no acute distress  HEENT: atraumatic, conjunttiva clear, no obvious abnormalities on inspection of external nose and ears  NECK: normal movements of the head and neck  LUNGS: on inspection no signs of respiratory distress, breathing rate appears normal, no obvious gross SOB, gasping or wheezing  CV: no obvious cyanosis  MS: moves all visible extremities without noticeable abnormality  PSYCH/NEURO: pleasant and cooperative, no obvious depression or anxiety, speech and thought processing grossly intact  ASSESSMENT AND PLAN:  Discussed the following assessment and plan:  Chlamydia Recently tested positive for chlamydia.   Will treat with azithromycin 1 g x1 He has already informed partner and they will get tested/treated as well.  He will let me know if symptoms persist or if he develops new symptoms after treatment.       I discussed the assessment and treatment plan with  the patient. The patient was provided an opportunity to ask questions and all were answered. The patient agreed with the plan and demonstrated an understanding of the instructions.   The patient was advised to call back or seek an in-person evaluation if the symptoms worsen or if the condition fails to improve as anticipated.    Everrett Coombe, DO

## 2020-02-07 NOTE — Assessment & Plan Note (Signed)
Recently tested positive for chlamydia.   Will treat with azithromycin 1 g x1 He has already informed partner and they will get tested/treated as well.  He will let me know if symptoms persist or if he develops new symptoms after treatment.

## 2020-06-09 ENCOUNTER — Other Ambulatory Visit: Payer: Self-pay

## 2020-06-09 ENCOUNTER — Encounter: Payer: Self-pay | Admitting: Emergency Medicine

## 2020-06-09 ENCOUNTER — Emergency Department (INDEPENDENT_AMBULATORY_CARE_PROVIDER_SITE_OTHER)
Admission: EM | Admit: 2020-06-09 | Discharge: 2020-06-09 | Disposition: A | Discharge status: Home / Self Care | Payer: Commercial Managed Care - PPO | Source: Home / Self Care | Attending: Family Medicine | Admitting: Family Medicine

## 2020-06-09 DIAGNOSIS — M25511 Pain in right shoulder: Secondary | ICD-10-CM

## 2020-06-09 MED ORDER — METHYLPREDNISOLONE 4 MG PO TBPK: ORAL_TABLET | ORAL | 0 refills | Status: AC

## 2020-06-09 NOTE — Discharge Instructions (Addendum)
Ice to area for 20 min 2 x a day Take the medrol as directed Take all of day one today See sport medicine if fails to improve  Give rotator cuff rehab exercises.

## 2020-06-09 NOTE — ED Provider Notes (Signed)
[facility]    CSN: [id number] Arrival date & time: 06/09/20  1630      History   Chief Complaint Chief Complaint  Patient presents with  . Shoulder Pain    HPI Cesar Daniel is a 35 y.o. male.   HPI   Patient is here for right shoulder pain.  He states is been bothering him for about 2 weeks.  It hurts with certain movements.  It awakens him at night.  He describes the pain as "hot" and "searing".  He has been able to continue to work.  He has taken occasional anti-inflammatory medicines.  He has never had shoulder problems before.  History reviewed. No pertinent past medical history.  Patient Active Problem List   Diagnosis Date Noted  . Exposure to sexually transmitted disease (STD) 02/07/2020  . Chlamydia 02/07/2020    Past Surgical History:  Procedure Laterality Date  . LACERATION REPAIR     Dog bite as child; eye injury as adult       Home Medications    Prior to Admission medications   Medication Sig Start Date End Date Taking? Authorizing Provider  methylPREDNISolone (MEDROL DOSEPAK) 4 MG TBPK tablet tad 06/09/20  Yes Eustace Moore, MD    Family History Family History  Problem Relation Age of Onset  . Diabetes Father   . Heart failure Father     Social History Social History   Tobacco Use  . Smoking status: Never Smoker  . Smokeless tobacco: Never Used  Vaping Use  . Vaping Use: Never used  Substance Use Topics  . Alcohol use: No  . Drug use: No     Allergies   Patient has no known allergies.   Review of Systems Review of Systems See HPI  Physical Exam Triage Vital Signs ED Triage Vitals  Enc Vitals Group     BP 06/09/20 1643 115/74     Pulse Rate 06/09/20 1643 88     Resp --      Temp 06/09/20 1643 98.4 F (36.9 C)     Temp Source 06/09/20 1643 Oral     SpO2 06/09/20 1643 97 %     Weight --      Height --      Head Circumference --      Peak Flow --      Pain Score 06/09/20 1641 4     Pain Loc --       Pain Edu? --      Excl. in GC? --    No data found.  Updated Vital Signs BP 115/74 (BP Location: Left Arm)   Pulse 88   Temp 98.4 F (36.9 C) (Oral)   SpO2 97%      Physical Exam Constitutional:      General: He is not in acute distress.    Appearance: He is well-developed and normal weight.  HENT:     Head: Normocephalic and atraumatic.     Nose:     Comments: Mask is in place Eyes:     Conjunctiva/sclera: Conjunctivae normal.     Pupils: Pupils are equal, round, and reactive to light.  Cardiovascular:     Rate and Rhythm: Normal rate.  Pulmonary:     Effort: Pulmonary effort is normal. No respiratory distress.  Abdominal:     General: There is no distension.     Palpations: Abdomen is soft.  Musculoskeletal:        General: Normal range of motion.  Cervical back: Normal range of motion.     Comments: Both shoulders have full range of motion.  No tenderness in the neck or trapezius.  The right shoulder has mild limitation and pain with external rotation in the abducted position.  Empty can testing is negative.  Skin:    General: Skin is warm and dry.  Neurological:     Mental Status: He is alert.  Psychiatric:        Behavior: Behavior normal.      UC Treatments / Results  Labs (all labs ordered are listed, but only abnormal results are displayed) Labs Reviewed - No data to display  EKG   Radiology No results found.  Procedures Procedures (including critical care time)  Medications Ordered in UC Medications - No data to display  Initial Impression / Assessment and Plan / UC Course  I have reviewed the triage vital signs and the nursing notes.  Pertinent labs & imaging results that were available during my care of the patient were reviewed by me and considered in my medical decision making (see chart for details).     Tatar cuff tendinitis.  We will treat with steroids and ice.  Follow-up with sports medicine Final Clinical Impressions(s)  / UC Diagnoses   Final diagnoses:  Acute pain of right shoulder     Discharge Instructions     Ice to area for 20 min 2 x a day Take the medrol as directed Take all of day one today See sport medicine if fails to improve  Give rotator cuff rehab exercises.   ED Prescriptions    Medication Sig Dispense Auth. Provider   methylPREDNISolone (MEDROL DOSEPAK) 4 MG TBPK tablet tad 21 tablet Eustace Moore, MD     PDMP not reviewed this encounter.   Eustace Moore, MD 06/09/20 [phone]

## 2020-06-09 NOTE — ED Triage Notes (Signed)
Patient presents to Urgent Care with complaints of right shoulder pain since 2 weeks. Patient reports pain is worsening over the past 2 weeks. Does a lot of lifting at work. He is right arm dominant. Has tried Tylenol or Ibuprofen at work

## 2021-03-02 ENCOUNTER — Telehealth: Payer: Self-pay | Admitting: General Practice

## 2021-03-02 NOTE — Telephone Encounter (Signed)
Transition Care Management Follow-up Telephone Call Date of discharge and from where: 02/28/21 from [facility] How have you been since you were released from the hospital? Left before he was seen. Got hurt at work and was sent to urgent care but continued to have pain. He went to the ER and waited five hours and left before he was seen. He states he is a little better but is being referred to an orthopedic from his work.  Any questions or concerns? No  Home Care and Equipment/Supplies: Were home health services ordered? no  Functional Questionnaire: (I = Independent and D = Dependent) ADLs: I  Bathing/Dressing- I  Meal Prep- I  Eating- I  Maintaining continence- I  Transferring/Ambulation- I  Managing Meds- I  Follow up appointments reviewed:  PCP Hospital f/u appt confirmed? No  Specialist Hospital f/u appt confirmed?  His work is sending him to see an orthopedic.    Are transportation arrangements needed? No  If their condition worsens, is the pt aware to call PCP or go to the Emergency Dept.? Yes Was the patient provided with contact information for the PCP's office or ED? Yes Was to pt encouraged to call back with questions or concerns? Yes

## 2023-06-14 ENCOUNTER — Other Ambulatory Visit (INDEPENDENT_AMBULATORY_CARE_PROVIDER_SITE_OTHER): Payer: Self-pay | Admitting: Physician Assistant
